# Patient Record
Sex: Male | Born: 1995 | Race: Black or African American | Hispanic: No | Marital: Single | Smoking: Never smoker
Health system: Southern US, Community
[De-identification: ages and names within clinical notes are randomized; demographics above are authoritative.]

## PROBLEM LIST (undated history)

## (undated) HISTORY — PX: TONSILLECTOMY: SUR1361

---
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
## 2017-01-14 ENCOUNTER — Encounter: Payer: Self-pay | Admitting: Emergency Medicine

## 2017-01-14 ENCOUNTER — Emergency Department
Admission: EM | Admit: 2017-01-14 | Discharge: 2017-01-14 | Disposition: A | Discharge status: Home / Self Care | Payer: Medicaid Other | Attending: Emergency Medicine | Admitting: Emergency Medicine

## 2017-01-14 DIAGNOSIS — R51 Headache: Secondary | ICD-10-CM | POA: Diagnosis present

## 2017-01-14 DIAGNOSIS — J0140 Acute pansinusitis, unspecified: Secondary | ICD-10-CM | POA: Insufficient documentation

## 2017-01-14 MED ORDER — PREDNISONE 10 MG PO TABS
ORAL_TABLET | ORAL | 0 refills | Status: AC
Start: 2017-01-14 — End: ?

## 2017-01-14 MED ORDER — FLUTICASONE PROPIONATE 50 MCG/ACT NA SUSP: 2.0000 | Freq: Every day | NASAL | 0 refills | Status: DC

## 2017-01-14 MED ORDER — AMOXICILLIN-POT CLAVULANATE 875-125 MG PO TABS: 1.0000 | ORAL_TABLET | Freq: Two times a day (BID) | ORAL | 0 refills | Status: AC

## 2017-01-14 NOTE — Discharge Instructions (Signed)
Follow up with your doctor if any continued problems.  Call Tuesday  Begin taking Augmentin 875 twice a day for 10 days. Flonase nasal spray 2 sprays to each nostril daily. Prednisone as directed starting with 6 tablets and tapering for the next 6 days. Increase fluids. Tylenol or ibuprofen if needed for headache.

## 2017-01-14 NOTE — ED Triage Notes (Signed)
States headache x 2 weeks, denies injury. States has green sinus drainage.

## 2017-01-14 NOTE — ED Provider Notes (Signed)
[facility] Emergency Department Provider Note ____________________________________________  Time seen: 10:32 AM  I have reviewed the triage vital signs and the nursing notes.  HISTORY  Chief Complaint  Headache   HPI Ronald Donovan is a 21 y.o. male is here complaining of headache for 2 weeks. Patient states he also has green sinus drainage. He has pressure around his eyes and also facial pain. He has been taking over-the-counter medication without any relief and also taken ibuprofen infrequently. He denies any previous sinusitis. He denies being a smoker. Patient has an occasional nonproductive cough. Currently he rates his pain as a 10 out of 10.  History reviewed. No pertinent past medical history.  There are no active problems to display for this patient.   Past Surgical History:  Procedure Laterality Date  . TONSILLECTOMY      Prior to Admission medications   Medication Sig Start Date End Date Taking? Authorizing Provider  amoxicillin-clavulanate (AUGMENTIN) 875-125 MG tablet Take 1 tablet by mouth 2 (two) times daily. 01/14/17 01/21/17  Tommi Rumps, PA-C  fluticasone (FLONASE) 50 MCG/ACT nasal spray Place 2 sprays into both nostrils daily. 01/14/17 01/14/18  Tommi Rumps, PA-C  predniSONE (DELTASONE) 10 MG tablet Take 6 tablets  today, on day 2 take 5 tablets, day 3 take 4 tablets, day 4 take 3 tablets, day 5 take  2 tablets and 1 tablet the last day 01/14/17   Tommi Rumps, PA-C    Allergies Patient has no known allergies.  No family history on file.  Social History Social History  Substance Use Topics  . Smoking status: Never Smoker  . Smokeless tobacco: Not on file  . Alcohol use Not on file    Review of Systems  Constitutional: Negative for fever. Eyes: Negative for visual changes. ENT: Positive for sore throat, positive for facial pain, positive for sinus pressure. Cardiovascular: Negative for chest pain. Respiratory:  Negative for shortness of breath. Gastrointestinal: Negative for abdominal pain, vomiting Musculoskeletal: Negative for back pain. Skin: Negative for rash. Neurological: Positive for headaches, no focal weakness or numbness. ____________________________________________  PHYSICAL EXAM:  VITAL SIGNS: ED Triage Vitals  Enc Vitals Group     BP 01/14/17 1009 (!) 124/51     Pulse Rate 01/14/17 1009 72     Resp 01/14/17 1009 20     Temp 01/14/17 1009 98.1 F (36.7 C)     Temp Source 01/14/17 1009 Oral     SpO2 01/14/17 1009 99 %     Weight 01/14/17 1009 255 lb (115.7 kg)     Height --      Head Circumference --      Peak Flow --      Pain Score 01/14/17 1008 10     Pain Loc --      Pain Edu? --      Excl. in GC? --     Constitutional: Alert and oriented. Well appearing and in no distress. Head: Normocephalic and atraumatic. Right frontal and maxillary sinuses tender to percussion. Eyes: Conjunctivae are normal.  Ears: Canals clear. TMs intact bilaterally. Nose: Moderate congestion/rhinorrhea.   Mouth/Throat: Mucous membranes are moist. Positive posterior drainage. Neck: Supple. Hematological/Lymphatic/Immunological: No cervical lymphadenopathy. Cardiovascular: Normal rate, regular rhythm. Normal distal pulses. Respiratory: Normal respiratory effort. No wheezes/rales/rhonchi. Musculoskeletal: Moves upper and lower extremities without any difficulty. Normal gait was noted. Neurologic:   Normal speech and language. No gross focal neurologic deficits are appreciated. Skin:  Skin is warm, dry and  intact. No rash noted. Psychiatric: Mood and affect are normal. Patient exhibits appropriate insight and judgment.   INITIAL IMPRESSION / ASSESSMENT AND PLAN / ED COURSE  Patient is encouraged take Tylenol or ibuprofen if needed for headache or any fever. He was started on Augmentin 875 twice a day for 10 days along with prednisone 60 mg 6 day taper and Flonase nasal spray. He is to  follow-up with his PCP if any continued problems. He is encouraged to call on Tuesday for an appointment.    ____________________________________________  FINAL CLINICAL IMPRESSION(S) / ED DIAGNOSES  Final diagnoses:  Acute non-recurrent pansinusitis     Tommi RumpsSummers, Rhonda L, PA-C 01/14/17 1059    Arnaldo NatalMalinda, Paul F, MD 01/14/17 [phone]

## 2017-01-14 NOTE — ED Notes (Signed)
See triage note  States he has had intermittent frontal headaches for 2 weeks  States headache became worse yesterday  No fever  Had 2 episodes of vomiting this am

## 2018-04-22 ENCOUNTER — Emergency Department
Admission: EM | Admit: 2018-04-22 | Discharge: 2018-04-22 | Disposition: A | Discharge status: Home / Self Care | Payer: Self-pay | Attending: Emergency Medicine | Admitting: Emergency Medicine

## 2018-04-22 ENCOUNTER — Encounter: Payer: Self-pay | Admitting: Emergency Medicine

## 2018-04-22 ENCOUNTER — Other Ambulatory Visit: Payer: Self-pay

## 2018-04-22 DIAGNOSIS — F1910 Other psychoactive substance abuse, uncomplicated: Secondary | ICD-10-CM | POA: Insufficient documentation

## 2018-04-22 LAB — BASIC METABOLIC PANEL
ANION GAP: 13 (ref 5–15)
BUN: 18 mg/dL (ref 6–20)
CALCIUM: 9.4 mg/dL (ref 8.9–10.3)
CO2: 25 mmol/L (ref 22–32)
Chloride: 101 mmol/L (ref 98–111)
Creatinine, Ser: 1.13 mg/dL (ref 0.61–1.24)
Glucose, Bld: 116 mg/dL — ABNORMAL HIGH (ref 70–99)
POTASSIUM: 3.7 mmol/L (ref 3.5–5.1)
SODIUM: 139 mmol/L (ref 135–145)

## 2018-04-22 LAB — URINE DRUG SCREEN, QUALITATIVE (ARMC ONLY)
AMPHETAMINES, UR SCREEN: NOT DETECTED
BARBITURATES, UR SCREEN: NOT DETECTED
Cannabinoid 50 Ng, Ur ~~LOC~~: POSITIVE — AB
Cocaine Metabolite,Ur ~~LOC~~: NOT DETECTED
MDMA (Ecstasy)Ur Screen: NOT DETECTED
METHADONE SCREEN, URINE: NOT DETECTED
Opiate, Ur Screen: POSITIVE — AB
Phencyclidine (PCP) Ur S: NOT DETECTED
Tricyclic, Ur Screen: NOT DETECTED

## 2018-04-22 LAB — CBC WITH DIFFERENTIAL/PLATELET
BASOS ABS: 0.1 10*3/uL (ref 0–0.1)
Basophils Relative: 1 %
Eosinophils Absolute: 0.2 10*3/uL (ref 0–0.7)
Eosinophils Relative: 3 %
HEMATOCRIT: 46.3 % (ref 40.0–52.0)
HEMOGLOBIN: 15.9 g/dL (ref 13.0–18.0)
LYMPHS PCT: 23 %
Lymphs Abs: 1.8 10*3/uL (ref 1.0–3.6)
MCH: 27.2 pg (ref 26.0–34.0)
MCHC: 34.4 g/dL (ref 32.0–36.0)
MCV: 79.1 fL — AB (ref 80.0–100.0)
Monocytes Absolute: 0.6 10*3/uL (ref 0.2–1.0)
Monocytes Relative: 8 %
NEUTROS ABS: 5.1 10*3/uL (ref 1.4–6.5)
NEUTROS PCT: 65 %
Platelets: 233 10*3/uL (ref 150–440)
RBC: 5.85 MIL/uL (ref 4.40–5.90)
RDW: 13.2 % (ref 11.5–14.5)
WBC: 7.7 10*3/uL (ref 3.8–10.6)

## 2018-04-22 LAB — ETHANOL

## 2018-04-22 NOTE — Discharge Instructions (Addendum)
Please seek medical attention for any high fevers, chest pain, shortness of breath, change in behavior, persistent vomiting, bloody stool or any other new or concerning symptoms.  

## 2018-04-22 NOTE — ED Notes (Signed)
Spoke with Dr. Don Perking regarding pt, verbal orders given for CBC, BMP, ETOH, and UDS.

## 2018-04-22 NOTE — ED Notes (Signed)
Pt slow to respond. Not answering questions as appropriate. Reports headache x2 days.

## 2018-04-22 NOTE — ED Triage Notes (Signed)
Pt to ED via POV with grandmother. Pt grandmother states that last night she went into the kitchen and pt was sitting in the floor and there was food all around him, pt was slurring his words. Pt states that he does not remember any of this. Pt grandmother states that his gait is off and that he was c/o headache when he first woke up. Pt is not slurring words now, Pt is A & O x 4. Pt denies use of ETOH, states that he did smoke marijuana yesterday afternoon. Pt is currently in NAD. Grips are equal bilaterally, no facial droop noted.

## 2018-04-22 NOTE — ED Provider Notes (Signed)
[facility] Emergency Department Provider Note  ____________________________________________   I have reviewed the triage vital signs and the nursing notes.   HISTORY  Chief Complaint Headache   History limited by: Not Limited   HPI Ronald Donovan is a 22 y.o. male who presents to the emergency department today because of an episode of altered mentation.  This apparently occurred last night.  It was witnessed by his grandmother. The patient denies any memory of the event. The patient states that he did have a headache yesterday. Admits to marijuana use. Denies any other substance abuse. At the time of my exam the patient states that he is feeling back to normal.    Per medical record review patient has a history of tonsillectomy.  History reviewed. No pertinent past medical history.  There are no active problems to display for this patient.   Past Surgical History:  Procedure Laterality Date  . TONSILLECTOMY      Prior to Admission medications   Medication Sig Start Date End Date Taking? Authorizing Provider  fluticasone (FLONASE) 50 MCG/ACT nasal spray Place 2 sprays into both nostrils daily. 01/14/17 01/14/18  Tommi Rumps, PA-C  predniSONE (DELTASONE) 10 MG tablet Take 6 tablets  today, on day 2 take 5 tablets, day 3 take 4 tablets, day 4 take 3 tablets, day 5 take  2 tablets and 1 tablet the last day 01/14/17   Tommi Rumps, PA-C    Allergies Bee venom  No family history on file.  Social History Social History   Tobacco Use  . Smoking status: Never Smoker  . Smokeless tobacco: Never Used  Substance Use Topics  . Alcohol use: Never    Frequency: Never  . Drug use: Yes    Types: Marijuana    Review of Systems Constitutional: No fever/chills Eyes: No visual changes. ENT: No sore throat. Cardiovascular: Denies chest pain. Respiratory: Denies shortness of breath. Gastrointestinal: No abdominal pain.  No nausea, no vomiting.   No diarrhea.   Genitourinary: Negative for dysuria. Musculoskeletal: Negative for back pain. Skin: Negative for rash. Neurological: Positive for headache now resolved.  ____________________________________________   PHYSICAL EXAM:  VITAL SIGNS: ED Triage Vitals  Enc Vitals Group     BP 04/22/18 1119 129/78     Pulse Rate 04/22/18 1119 63     Resp 04/22/18 1119 18     Temp 04/22/18 1119 98.5 F (36.9 C)     Temp Source 04/22/18 1119 Oral     SpO2 04/22/18 1119 100 %     Weight 04/22/18 1120 280 lb (127 kg)     Height 04/22/18 1120 6' (1.829 m)     Head Circumference --      Peak Flow --      Pain Score 04/22/18 1125 0   Constitutional: Alert and oriented.  Eyes: Conjunctivae are normal.  ENT      Head: Normocephalic and atraumatic.      Nose: No congestion/rhinnorhea.      Mouth/Throat: Mucous membranes are moist.      Neck: No stridor. Hematological/Lymphatic/Immunilogical: No cervical lymphadenopathy. Cardiovascular: Normal rate, regular rhythm.  No murmurs, rubs, or gallops.  Respiratory: Normal respiratory effort without tachypnea nor retractions. Breath sounds are clear and equal bilaterally. No wheezes/rales/rhonchi. Gastrointestinal: Soft and non tender. No rebound. No guarding.  Genitourinary: Deferred Musculoskeletal: Normal range of motion in all extremities. No lower extremity edema. Neurologic:  Normal speech and language. No gross focal neurologic deficits are appreciated.  Skin:  Skin is warm, dry and intact. No rash noted. Psychiatric: Mood and affect are normal. Speech and behavior are normal. Patient exhibits appropriate insight and judgment.  ____________________________________________    LABS (pertinent positives/negatives)  Ethanol <10 BMP wnl except glu 116 CBC wbc 7.7, hgb 15.9, plt 233 UDS positive opiate and cannabinoid  ____________________________________________   EKG  None  ____________________________________________     RADIOLOGY  None  ____________________________________________   PROCEDURES  Procedures  ____________________________________________   INITIAL IMPRESSION / ASSESSMENT AND PLAN / ED COURSE  Pertinent labs & imaging results that were available during my care of the patient were reviewed by me and considered in my medical decision making (see chart for details).   Patient presents to the emergency department because of concern for an episode of altered mental status yesterday. At the time of my exam patient back to baseline. At this point think AMS likely secondary to drug use. UDS positive for both cannabinoid and opiate. Discussed this with the patient. Discussed drug cessation.   ____________________________________________   FINAL CLINICAL IMPRESSION(S) / ED DIAGNOSES  Final diagnoses:  Polysubstance abuse (HCC)     Note: This dictation was prepared with Dragon dictation. Any transcriptional errors that result from this process are unintentional     Phineas Semen, MD 04/22/18 1433

## 2022-07-22 ENCOUNTER — Emergency Department
Admission: EM | Admit: 2022-07-22 | Discharge: 2022-07-22 | Disposition: A | Discharge status: Home / Self Care | Payer: Self-pay | Attending: Emergency Medicine | Admitting: Emergency Medicine

## 2022-07-22 ENCOUNTER — Other Ambulatory Visit: Payer: Self-pay

## 2022-07-22 DIAGNOSIS — J101 Influenza due to other identified influenza virus with other respiratory manifestations: Secondary | ICD-10-CM | POA: Insufficient documentation

## 2022-07-22 DIAGNOSIS — R638 Other symptoms and signs concerning food and fluid intake: Secondary | ICD-10-CM | POA: Insufficient documentation

## 2022-07-22 DIAGNOSIS — Z1152 Encounter for screening for COVID-19: Secondary | ICD-10-CM | POA: Insufficient documentation

## 2022-07-22 LAB — RESP PANEL BY RT-PCR (FLU A&B, COVID) ARPGX2
Influenza A by PCR: POSITIVE — AB
Influenza B by PCR: NEGATIVE
SARS Coronavirus 2 by RT PCR: NEGATIVE

## 2022-07-22 MED ORDER — BENZONATATE 100 MG PO CAPS: 100.0000 mg | ORAL_CAPSULE | Freq: Three times a day (TID) | ORAL | 0 refills | Status: AC | PRN

## 2022-07-22 MED ORDER — ONDANSETRON 4 MG PO TBDP: 4.0000 mg | ORAL_TABLET | Freq: Three times a day (TID) | ORAL | 0 refills | Status: AC | PRN

## 2022-07-22 MED ORDER — OSELTAMIVIR PHOSPHATE 75 MG PO CAPS: 75.0000 mg | ORAL_CAPSULE | Freq: Two times a day (BID) | ORAL | 0 refills | Status: AC

## 2022-07-22 NOTE — ED Triage Notes (Signed)
Pt comes with c/o cough, sore throat and body aches. Pt states this all just started.

## 2022-07-22 NOTE — Discharge Instructions (Addendum)
You can take Tylenol 1 g every 8 hours.  You can take ibuprofen as well as needed for pain 600mg  every 8 hours as needed. Take tessalon pearls for cough, zofran for nausea and tamiflu for the flu.   Return to the ER if you develop worsening shortness of breath, or any other concerns

## 2022-07-22 NOTE — ED Provider Notes (Addendum)
[facility] Provider Note    Event Date/Time   First MD Initiated Contact with Patient 07/22/22 1203     (approximate)   History   Influenza   HPI  Ronald Donovan is a 26 y.o. male  who comes in with cough, sore throat, body aches that all just started today.  Patient reports symptom started yesterday.  He reports that his grandma was also positive for the flu.  He reports some nausea as well as some decreased p.o. intake.  He reports some intermittent fevers mostly yesterday but none today.  He is otherwise healthy does not take any medications.   Physical Exam   Triage Vital Signs: ED Triage Vitals [07/22/22 1116]  Enc Vitals Group     BP      Pulse      Resp      Temp      Temp src      SpO2      Weight      Height      Head Circumference      Peak Flow      Pain Score 5     Pain Loc      Pain Edu?      Excl. in GC?     Most recent vital signs: Vitals:   07/22/22 1221  BP: (!) 150/75  Pulse: 73  Resp: 18  SpO2: 97%     General: Awake, no distress.  CV:  Good peripheral perfusion.  Resp:  Normal effort.  Clear lungs Abd:  No distention.  Soft nontender Other:  OP is clear without any exudates. Midline uvula    ED Results / Procedures / Treatments   Labs (all labs ordered are listed, but only abnormal results are displayed) Labs Reviewed  RESP PANEL BY RT-PCR (FLU A&B, COVID) ARPGX2 - Abnormal; Notable for the following components:      Result Value   Influenza A by PCR POSITIVE (*)    All other components within normal limits     PROCEDURES:  Critical Care performed: No  Procedures   MEDICATIONS ORDERED IN ED: Medications - No data to display   IMPRESSION / MDM / ASSESSMENT AND PLAN / ED COURSE  I reviewed the triage vital signs and the nursing notes.   Patient's presentation is most consistent with acute presentation with potential threat to life or bodily function.   COVID, flu, RSV.  No pneumonia  based upon clear lungs and normal oxygen level.  Abdomen soft and nontender.  Vital signs are reassuring without any evidence of significant dehydration to suggest just tachycardia or hypotension.  Blood pressures are little bit elevated and he will follow-up for recheck but suspect is more likely related to being sick.  We discussed pros and cons of Tamiflu given he is otherwise healthy but is within the 48-hour window and he would like to proceed with Tamiflu and understands the risk of the medication.  Patient will be described symptomatic management with Zofran, Tamiflu, Tessalon Perles recommended Tylenol and staying well-hydrated and return to the ER if he develops worsening symptoms or any other concerns   Normal CR 4 yrs ago and     FINAL CLINICAL IMPRESSION(S) / ED DIAGNOSES   Final diagnoses:  Influenza A     Rx / DC Orders   ED Discharge Orders          Ordered    ondansetron (ZOFRAN-ODT) 4 MG disintegrating tablet  Every  8 hours PRN        07/22/22 1224    oseltamivir (TAMIFLU) 75 MG capsule  2 times daily        07/22/22 1224    benzonatate (TESSALON PERLES) 100 MG capsule  3 times daily PRN        07/22/22 1224             Note:  This document was prepared using Dragon voice recognition software and may include unintentional dictation errors.   Concha Se, MD 07/22/22 1227    Concha Se, MD 07/22/22 [phone]

## 2022-09-05 ENCOUNTER — Other Ambulatory Visit: Payer: Self-pay

## 2022-09-05 ENCOUNTER — Emergency Department
Admission: EM | Admit: 2022-09-05 | Discharge: 2022-09-05 | Disposition: A | Discharge status: Home / Self Care | Payer: Self-pay | Attending: Emergency Medicine | Admitting: Emergency Medicine

## 2022-09-05 ENCOUNTER — Emergency Department: Payer: Self-pay

## 2022-09-05 DIAGNOSIS — R112 Nausea with vomiting, unspecified: Secondary | ICD-10-CM | POA: Insufficient documentation

## 2022-09-05 DIAGNOSIS — J00 Acute nasopharyngitis [common cold]: Secondary | ICD-10-CM | POA: Insufficient documentation

## 2022-09-05 DIAGNOSIS — R110 Nausea: Secondary | ICD-10-CM

## 2022-09-05 DIAGNOSIS — R519 Headache, unspecified: Secondary | ICD-10-CM | POA: Insufficient documentation

## 2022-09-05 DIAGNOSIS — Z1152 Encounter for screening for COVID-19: Secondary | ICD-10-CM | POA: Insufficient documentation

## 2022-09-05 LAB — BASIC METABOLIC PANEL
Anion gap: 9 (ref 5–15)
BUN: 11 mg/dL (ref 6–20)
CO2: 25 mmol/L (ref 22–32)
Calcium: 9.4 mg/dL (ref 8.9–10.3)
Chloride: 101 mmol/L (ref 98–111)
Creatinine, Ser: 0.92 mg/dL (ref 0.61–1.24)
GFR, Estimated: >60 mL/min (ref >60)
Glucose, Bld: 120 mg/dL — ABNORMAL HIGH (ref 70–99)
Potassium: 3.7 mmol/L (ref 3.5–5.1)
Sodium: 135 mmol/L (ref 135–145)

## 2022-09-05 LAB — CBC
HCT: 47 % (ref 39.0–52.0)
Hemoglobin: 15.5 g/dL (ref 13.0–17.0)
MCH: 26.1 pg (ref 26.0–34.0)
MCHC: 33 g/dL (ref 30.0–36.0)
MCV: 79 fL — ABNORMAL LOW (ref 80.0–100.0)
Platelets: 296 10*3/uL (ref 150–400)
RBC: 5.95 MIL/uL — ABNORMAL HIGH (ref 4.22–5.81)
RDW: 12.4 % (ref 11.5–15.5)
WBC: 7.7 10*3/uL (ref 4.0–10.5)
nRBC: 0 % (ref 0.0–0.2)

## 2022-09-05 LAB — RESP PANEL BY RT-PCR (FLU A&B, COVID) ARPGX2
Influenza A by PCR: NEGATIVE
Influenza B by PCR: NEGATIVE
SARS Coronavirus 2 by RT PCR: NEGATIVE

## 2022-09-05 MED ORDER — METOCLOPRAMIDE HCL 5 MG/ML IJ SOLN
10.0000 mg | Freq: Once | INTRAMUSCULAR | Status: AC
  Administered 2022-09-05: 10 mg via INTRAVENOUS
  Filled 2022-09-05: qty 2

## 2022-09-05 MED ORDER — ACETAMINOPHEN 500 MG PO TABS
1000.0000 mg | ORAL_TABLET | ORAL | Status: AC
  Administered 2022-09-05: 1000 mg via ORAL
  Filled 2022-09-05: qty 2

## 2022-09-05 MED ORDER — SODIUM CHLORIDE 0.9 % IV BOLUS
1000.0000 mL | Freq: Once | INTRAVENOUS | Status: AC
  Administered 2022-09-05: 1000 mL via INTRAVENOUS

## 2022-09-05 MED ORDER — KETOROLAC TROMETHAMINE 30 MG/ML IJ SOLN
30.0000 mg | Freq: Once | INTRAMUSCULAR | Status: AC
  Administered 2022-09-05: 30 mg via INTRAVENOUS
  Filled 2022-09-05: qty 1

## 2022-09-05 NOTE — Discharge Instructions (Addendum)
You have been seen in the Emergency Department (ED) for a headache.  Please use Tylenol or Motrin as needed for symptoms, but only as written on the box.   As we have discussed, please follow up with your primary care doctor as soon as possible regarding today's Emergency Department (ED) visit and your headache symptoms.    Call your doctor or return to the ED if you have a worsening headache, sudden and severe headache, confusion, slurred speech, facial droop, weakness or numbness in any arm or leg, extreme fatigue, vision problems, or other symptoms that concern you.     Please go to the following website to schedule new (and existing) patient appointments:   [email or web address]   The following is a list of primary care offices in the area who are accepting new patients at this time.  Please reach out to one of them directly and let them know you would like to schedule an appointment to follow up on an Emergency Department visit, and/or to establish a new primary care provider (PCP).  There are likely other primary care clinics in the are who are accepting new patients, but this is an excellent place to start:  [place] physician: Dr Lavon Paganini [address] [place], [place] [address] [phone]  [facility] Lead Physician: Dr Steele Sizer [address], [place], [place] [address] [phone]  [place] Physician: Dr Park Liter [address] [place], [place] [address] [phone]  [facility] Lead Physician: Dr Dewaine Oats [address] [place], [place] [address] [phone]  [facility] [place] Physician: Dr Halina Maidens [address] [place], [place], [place] [address] [phone]

## 2022-09-05 NOTE — ED Triage Notes (Signed)
Pt presents to the ED via POV from home due to headache that started yesterday morning. Pt states NV. Pt does not have a history of migraine. Pt A&Ox4

## 2022-09-05 NOTE — ED Provider Notes (Signed)
[facility] Provider Note    Event Date/Time   First MD Initiated Contact with Patient 09/05/22 1000     (approximate)   History   Migraine   HPI  Ronald Donovan is a 27 y.o. male reports no major medical history no drug allergies  Patient reports that he is having a "migraine"  Reports has had the same once before several years ago, he developed a right-sided throbbing headache and vomiting.  He reports the same symptoms today, yesterday started developing a headache, throbbing over the right side of his face with mild nausea.  Today it is caused him to start vomiting.  Denies any fevers or chills no neck pain.  He does report a couple weeks ago he got over influenza.  He has not had any cough chills body aches or infectious symptoms.  Denies abdominal pain reports that the headache is causing him nausea and to vomit.  Reports that this has happened to him at least 1 time in the past and it was felt to be due to a "migraine" as he describes it (I do not have documentation of this previous evaluation or a diagnosis of migraines in [facility] or our system, but patient does give a clear history or same type of headache, R sided, etc)   No abdominal pain no chest pain no trouble breathing  Denies any numbness or weakness or difficulty walking or use of the arms.  No speech changes  He is here with his grandmother  Physical Exam   Triage Vital Signs: ED Triage Vitals  Enc Vitals Group     BP 09/05/22 0941 130/68     Pulse Rate 09/05/22 0941 60     Resp 09/05/22 0941 18     Temp 09/05/22 0941 98.4 F (36.9 C)     Temp Source 09/05/22 0941 Oral     SpO2 09/05/22 0941 99 %     Weight 09/05/22 0937 280 lb (127 kg)     Height 09/05/22 0937 6' (1.829 m)     Head Circumference --      Peak Flow --      Pain Score 09/05/22 0936 10     Pain Loc --      Pain Edu? --      Excl. in [place]? --     Most recent vital signs: Vitals:   09/05/22 0941  BP:  130/68  Pulse: 60  Resp: 18  Temp: 98.4 F (36.9 C)  SpO2: 99%     General: Awake, no distress.  He is standing in the room walking without difficulty.  He is noted to have emesis of food content.  Reports that he has been feeling nauseated and is now just began vomiting. He has very mild clear coryza.  Patient sniffling occasionally.   Normocephalic atraumatic.  Pupils equal round reactive to light and accommodation.  Extraocular movements are normal.  Moves all extremities without deficit.  Speech is clear there is no facial droop. CV:  Good peripheral perfusion.  Normal tones and rate Resp:  Normal effort.  Clear bilaterally Abd:  No distention.  Soft nontender nondistended.  After he vomited once nonbloody emesis, he reports his symptoms have improved.  He does not have any abdominal pain Other:     ED Results / Procedures / Treatments   Labs (all labs ordered are listed, but only abnormal results are displayed) Labs Reviewed  CBC - Abnormal; Notable for the following components:  Result Value   RBC 5.95 (*)    MCV 79.0 (*)    All other components within normal limits  BASIC METABOLIC PANEL - Abnormal; Notable for the following components:   Glucose, Bld 120 (*)    All other components within normal limits  RESP PANEL BY RT-PCR (FLU A&B, COVID) [id number]     EKG     RADIOLOGY  CT head interpreted by me as negative for acute gross intracranial hemorrhage or mass lesion   CT Head Wo Contrast  Result Date: 09/05/2022 CLINICAL DATA:  Headache. Tension type with throbbing to right side. EXAM: CT HEAD WITHOUT CONTRAST TECHNIQUE: Contiguous axial images were obtained from the base of the skull through the vertex without intravenous contrast. RADIATION DOSE REDUCTION: This exam was performed according to the departmental dose-optimization program which includes automated exposure control, adjustment of the mA and/or kV according to patient size and/or use of iterative  reconstruction technique. COMPARISON:  None Available. FINDINGS: Brain: No evidence of acute infarction, hemorrhage, hydrocephalus, extra-axial collection or mass lesion/mass effect. Vascular: No hyperdense vessel or unexpected calcification. Skull: Normal. Negative for fracture or focal lesion. Sinuses/Orbits: Mucous retention cyst identified in the sphenoid sinus. No acute abnormality. Other: None. IMPRESSION: 1. No acute intracranial abnormalities. 2. Mucous retention cyst in the sphenoid sinus. Electronically Signed   By: Kerby Moors M.D.   On: 09/05/2022 10:36      PROCEDURES:  Critical Care performed: No  Procedures   MEDICATIONS ORDERED IN ED: Medications  sodium chloride 0.9 % bolus 1,000 mL (1,000 mLs Intravenous New Bag/Given 09/05/22 1043)  metoCLOPramide (REGLAN) injection 10 mg (10 mg Intravenous Given 09/05/22 1044)  ketorolac (TORADOL) 30 MG/ML injection 30 mg (30 mg Intravenous Given 09/05/22 1044)  acetaminophen (TYLENOL) tablet 1,000 mg (1,000 mg Oral Given 09/05/22 1125)     IMPRESSION / MDM / ASSESSMENT AND PLAN / ED COURSE  I reviewed the triage vital signs and the nursing notes.                              Differential diagnosis includes, but is not limited to, headache syndrome, possible migraine-like symptom, but also further evaluation include evaluation for causes such as albeit rare intracranial hemorrhage, subarachnoid hemorrhage, gross lesion or tumor, mass, elevated ICP, venous thromboembolism, etc.  Based on the clinical history provided though and his reassuring examination with normal neurologic examination I suspect this is unlikely to be an acute emergent causation of headache, but no previous imaging of the brain will obtain a CT of the head without contrast for acute gross examination.  Does not present with a sudden onset, mediator thunderclap-like headache that would be highly suggestive of an acute vascular causation.  No meningismus good range of  motion the neck without pain afebrile, no signs or symptoms of be highly suggestive of a concern for meningitis encephalitis or acute infectious causation of the central nervous system.  No associated abdominal pain or GI symptoms, suspect vomiting is related to the nausea from the headache.  Will trial treatment including fluids Toradol and antiemetic  Patient's presentation is most consistent with acute complicated illness / injury requiring diagnostic workup.   ----------------------------------------- 11:35 AM on 09/05/2022 ----------------------------------------- Nausea and vomiting have resolved.  Patient reports moderate improvement with ongoing mild primarily right-sided throbbing headache.  Additional medication including oral Tylenol ordered.  He is resting comfortably at this time without distress.  Completing IV fluids.  In  light of his very mild cough and runny nose I suspect there may be a viral element to his headache as well.  Awaiting COVID studies.  Ongoing care including reassessment with current interventions and follow-up reassessment assigned to my partner Dr. Derrill Kay.        FINAL CLINICAL IMPRESSION(S) / ED DIAGNOSES   Final diagnoses:  Acute nonintractable headache, unspecified headache type  Nausea  Acute coryza     Rx / DC Orders   ED Discharge Orders     None        Note:  This document was prepared using Dragon voice recognition software and may include unintentional dictation errors.   Sharyn Creamer, MD 09/05/22 1136

## 2023-03-31 ENCOUNTER — Ambulatory Visit
Admission: EM | Admit: 2023-03-31 | Discharge: 2023-03-31 | Disposition: A | Discharge status: Home / Self Care | Payer: Self-pay | Attending: Family Medicine | Admitting: Family Medicine

## 2023-03-31 DIAGNOSIS — Z113 Encounter for screening for infections with a predominantly sexual mode of transmission: Secondary | ICD-10-CM | POA: Insufficient documentation

## 2023-03-31 NOTE — ED Provider Notes (Signed)
Ronald Donovan    CSN: [id number] Arrival date & time: 03/31/23  1146      History   Chief Complaint Chief Complaint  Patient presents with   Exposure to STD    HPI Ronald Donovan is a 27 y.o. male.   HPI Patient here for asymptomatic STD testing. Reports that her recent partner divulged to him that she tested positive for chlamydia.  He is asymptomatic however would like to rule out that he has an active infection.  He declines HIV and RPR testing today. History reviewed. No pertinent past medical history.  There are no problems to display for this patient.   Past Surgical History:  Procedure Laterality Date   TONSILLECTOMY         Home Medications    Prior to Admission medications   Medication Sig Start Date End Date Taking? Authorizing Provider  benzonatate (TESSALON PERLES) 100 MG capsule Take 1 capsule (100 mg total) by mouth 3 (three) times daily as needed for cough. 07/22/22 07/22/23  Concha Se, MD  predniSONE (DELTASONE) 10 MG tablet Take 6 tablets  today, on day 2 take 5 tablets, day 3 take 4 tablets, day 4 take 3 tablets, day 5 take  2 tablets and 1 tablet the last day 01/14/17   Tommi Rumps, PA-C    Family History History reviewed. No pertinent family history.  Social History Social History   Tobacco Use   Smoking status: Never   Smokeless tobacco: Never  Substance Use Topics   Alcohol use: Never   Drug use: Yes    Types: Marijuana     Allergies   Bee venom   Review of Systems Review of Systems Pertinent negatives listed in HPI   Physical Exam Triage Vital Signs ED Triage Vitals  Encounter Vitals Group     BP 03/31/23 1242 133/69     Systolic BP Percentile --      Diastolic BP Percentile --      Pulse Rate 03/31/23 1242 66     Resp 03/31/23 1242 18     Temp 03/31/23 1242 97.9 F (36.6 C)     Temp src --      SpO2 03/31/23 1242 98 %     Weight --      Height --      Head Circumference --      Peak Flow --       Pain Score 03/31/23 1244 0     Pain Loc --      Pain Education --      Exclude from Growth Chart --    No data found.  Updated Vital Signs BP 133/69   Pulse 66   Temp 97.9 F (36.6 C)   Resp 18   SpO2 98%   Visual Acuity Right Eye Distance:   Left Eye Distance:   Bilateral Distance:    Right Eye Near:   Left Eye Near:    Bilateral Near:     Physical Exam General appearance: Alert, well developed, well nourished, cooperative  Head: Normocephalic, without obvious abnormality, atraumatic Heart: Rate and rhythm normal. No gallop or murmurs noted on exam  Respiratory: Respirations even and unlabored, normal respiratory rate Extremities: No gross deformities Skin: Skin color, texture, turgor normal. No rashes seen  Psych: Appropriate mood and affect. Cytology self collected UC Treatments / Results  Labs (all labs ordered are listed, but only abnormal results are displayed) Labs Reviewed  CYTOLOGY, (ORAL, ANAL, URETHRAL)  ANCILLARY ONLY    EKG   Radiology No results found.  Procedures Procedures (including critical care time)  Medications Ordered in UC Medications - No data to display  Initial Impression / Assessment and Plan / UC Course  I have reviewed the triage vital signs and the nursing notes.  Pertinent labs & imaging results that were available during my care of the patient were reviewed by me and considered in my medical decision making (see chart for details).    For routine STDs, asymptomatic.  Patient advised that our office will contact them only if he requires any treatment or his results are abnormal.  Encouraged barrier protection to prevent spread of unknown infection. Final Clinical Impressions(s) / UC Diagnoses   Final diagnoses:  Screening for STD (sexually transmitted disease)     Discharge Instructions      Office will only contact you if your results are positive.  Activate MyChart to be able to view your test results once they have  resulted in the lab.  The results are known recommend using barrier protection such as a condom.   ED Prescriptions   None    PDMP not reviewed this encounter.   Bing Neighbors, NP 03/31/23 1438

## 2023-03-31 NOTE — ED Triage Notes (Signed)
Pt states he he may have been exposed to chlamydia and would like to be tested for it. Currently no symptoms.

## 2023-03-31 NOTE — Discharge Instructions (Addendum)
Office will only contact you if your results are positive.  Activate MyChart to be able to view your test results once they have resulted in the lab.  The results are known recommend using barrier protection such as a condom.
